# Patient Record
Sex: Male | Born: 1985 | Race: Black or African American | Hispanic: No | Marital: Single | Smoking: Current every day smoker
Health system: Southern US, Community
[De-identification: ages and names within clinical notes are randomized; demographics above are authoritative.]

## PROBLEM LIST (undated history)

## (undated) DIAGNOSIS — M419 Scoliosis, unspecified: Secondary | ICD-10-CM

## (undated) HISTORY — PX: TONSILLECTOMY: SUR1361

---

## 2018-07-19 ENCOUNTER — Emergency Department: Payer: Self-pay

## 2018-07-19 ENCOUNTER — Other Ambulatory Visit: Payer: Self-pay

## 2018-07-19 ENCOUNTER — Emergency Department
Admission: EM | Admit: 2018-07-19 | Discharge: 2018-07-19 | Disposition: A | Discharge status: Home / Self Care | Payer: Self-pay | Attending: Emergency Medicine | Admitting: Emergency Medicine

## 2018-07-19 DIAGNOSIS — M5431 Sciatica, right side: Secondary | ICD-10-CM | POA: Insufficient documentation

## 2018-07-19 HISTORY — DX: Scoliosis, unspecified: M41.9

## 2018-07-19 MED ORDER — PREDNISONE 10 MG PO TABS: 10.0000 mg | ORAL_TABLET | Freq: Every day | ORAL | 0 refills | Status: DC

## 2018-07-19 MED ORDER — TRAMADOL HCL 50 MG PO TABS: 50.0000 mg | ORAL_TABLET | Freq: Four times a day (QID) | ORAL | 0 refills | Status: DC | PRN

## 2018-07-19 MED ORDER — CYCLOBENZAPRINE HCL 10 MG PO TABS
10.0000 mg | ORAL_TABLET | Freq: Once | ORAL | Status: AC
  Administered 2018-07-19: 10 mg via ORAL
  Filled 2018-07-19: qty 1

## 2018-07-19 MED ORDER — TRAMADOL HCL 50 MG PO TABS
50.0000 mg | ORAL_TABLET | Freq: Once | ORAL | Status: AC
  Administered 2018-07-19: 50 mg via ORAL
  Filled 2018-07-19: qty 1

## 2018-07-19 MED ORDER — PREDNISONE 20 MG PO TABS
60.0000 mg | ORAL_TABLET | Freq: Once | ORAL | Status: AC
  Administered 2018-07-19: 23:00:00 60 mg via ORAL
  Filled 2018-07-19: qty 3

## 2018-07-19 MED ORDER — CYCLOBENZAPRINE HCL 5 MG PO TABS: 5.0000 mg | ORAL_TABLET | Freq: Three times a day (TID) | ORAL | 0 refills | Status: DC | PRN

## 2018-07-19 NOTE — Discharge Instructions (Signed)
Please take medications as prescribed.  Avoid heavy lifting pushing pulling.  If any increasing pain burning numbness tingling or weakness return to the ER.  Follow-up with primary care provider in 1 week if no improvement.

## 2018-07-19 NOTE — ED Triage Notes (Signed)
Pt comes via POV from home with c/o right leg pain. Pt states pain shooting from butt bone to calf and foot.  Pt states it is tingling.  Pt state this started last night while at work. Pt states he works in a [employer]. Pt denies any blurred vision, chest pain or SOB.  Pt denies any recent long distance trips. Pt states pain to bear weight.

## 2018-07-19 NOTE — ED Provider Notes (Signed)
[facility] EMERGENCY DEPARTMENT Provider Note   CSN: [id number] Arrival date & time: 07/19/18  1924     History   Chief Complaint Chief Complaint  Patient presents with  . Leg Pain    HPI Maxwell Vasquez is a 33 y.o. male presents to the emergency department for evaluation of right lumbar radicular symptoms.  Patient states yesterday developed pain burning numbness and tingling in the right lower back, lateral hip, lateral calf and into the lateral aspect of ankle.  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  No fevers, loss of bowel or bladder symptoms.  Pain is moderate.  Has not take any medications for pain.  He is ambulatory no assistive device.    HPI  Past Medical History:  Diagnosis Date  . Scoliosis     There are no active problems to display for this patient.   Past Surgical History:  Procedure Laterality Date  . TONSILLECTOMY          Home Medications    Prior to Admission medications   Medication Sig Start Date End Date Taking? Authorizing Provider  cyclobenzaprine (FLEXERIL) 5 MG tablet Take 1-2 tablets (5-10 mg total) by mouth 3 (three) times daily as needed for muscle spasms. 07/19/18   Duanne Guess, PA-C  predniSONE (DELTASONE) 10 MG tablet Take 1 tablet (10 mg total) by mouth daily. 6,5,4,3,2,1 six day taper 07/19/18   Duanne Guess, PA-C  traMADol (ULTRAM) 50 MG tablet Take 1 tablet (50 mg total) by mouth every 6 (six) hours as needed. 07/19/18 07/19/19  Duanne Guess, PA-C    Family History No family history on file.  Social History Social History   Tobacco Use  . Smoking status: Not on file  Substance Use Topics  . Alcohol use: Not on file  . Drug use: Not on file     Allergies   Patient has no allergy information on record.   Review of Systems Review of Systems  Constitutional: Negative for fever.  Gastrointestinal: Negative for abdominal pain.   Genitourinary: Negative for discharge and dysuria.  Musculoskeletal: Positive for back pain and myalgias. Negative for gait problem.  Skin: Negative for rash.  Neurological: Positive for numbness.     Physical Exam Updated Vital Signs BP 126/74   Pulse 72   Temp 98.7 F (37.1 C)   Resp 18   Ht 6\' 7"  (2.007 m)   Wt 104.3 kg   SpO2 99%   BMI 25.91 kg/m   Physical Exam Constitutional:      Appearance: He is well-developed.  HENT:     Head: Normocephalic and atraumatic.  Eyes:     Conjunctiva/sclera: Conjunctivae normal.  Neck:     Musculoskeletal: Normal range of motion.  Cardiovascular:     Rate and Rhythm: Normal rate.  Pulmonary:     Effort: Pulmonary effort is normal. No respiratory distress.  Musculoskeletal: Normal range of motion.     Comments: Lumbar Spine: Examination of the lumbar spine reveals no bony abnormality, no edema, and no ecchymosis.  There is no step off.  The patient has full range of motion of the lumbar spine with flexion and extension.  The patient has normal lateral bend and rotation.  The patient has no pain with range of motion activities.  The patient has a negative axial load test, and a negative rotational Waddell test.  The patient is non tender along the  spinous process.  The patient is non tender along the paravertebral muscles, with no muscle spasms.  The patient is non tender along the iliac crest.  The patient is non tender in the sciatic notch.  The patient is non tender along the Sacroiliac joint.  There is no Coccyx joint tenderness.    Bilateral Lower Extremities: Examination of the lower extremities reveals no bony abnormality, no edema, and no ecchymosis.  The patient has full active and passive range of motion of the hips, knees, and ankles.  There is no discomfort with range of motion exercises.  The patient is non tender along the greater trochanter region.  The patient has a negative Denna HaggardHomans' test bilaterally.  There is normal skin  warmth.  There is normal capillary refill bilaterally.    Neurologic: The patient has a negative straight leg raise.  The patient has normal muscle strength testing for the quadriceps, calves, ankle dorsiflexion, ankle plantarflexion, and extensor hallicus longus.  The patient has sensation that is intact to light touch.  Patellar reflexes are normal bilaterally.   Skin:    General: Skin is warm.     Findings: No rash.  Neurological:     Mental Status: He is alert and oriented to person, place, and time.  Psychiatric:        Behavior: Behavior normal.        Thought Content: Thought content normal.      ED Treatments / Results  Labs (all labs ordered are listed, but only abnormal results are displayed) Labs Reviewed - No data to display  EKG None  Radiology [place] Venous Img Lower Unilateral Right  Result Date: 07/19/2018 CLINICAL DATA:  Calf pain EXAM: RIGHT LOWER EXTREMITY VENOUS DOPPLER ULTRASOUND TECHNIQUE: Gray-scale sonography with graded compression, as well as color Doppler and duplex ultrasound were performed to evaluate the lower extremity deep venous systems from the level of the common femoral vein and including the common femoral, femoral, profunda femoral, popliteal and calf veins including the posterior tibial, peroneal and gastrocnemius veins when visible. The superficial great saphenous vein was also interrogated. Spectral Doppler was utilized to evaluate flow at rest and with distal augmentation maneuvers in the common femoral, femoral and popliteal veins. COMPARISON:  None. FINDINGS: Contralateral Common Femoral Vein: Respiratory phasicity is normal and symmetric with the symptomatic side. No evidence of thrombus. Normal compressibility. Common Femoral Vein: No evidence of thrombus. Normal compressibility, respiratory phasicity and response to augmentation. Saphenofemoral Junction: No evidence of thrombus. Normal compressibility and flow on color Doppler imaging. Profunda  Femoral Vein: No evidence of thrombus. Normal compressibility and flow on color Doppler imaging. Femoral Vein: No evidence of thrombus. Normal compressibility, respiratory phasicity and response to augmentation. Popliteal Vein: No evidence of thrombus. Normal compressibility, respiratory phasicity and response to augmentation. Calf Veins: No evidence of thrombus. Normal compressibility and flow on color Doppler imaging. Superficial Great Saphenous Vein: No evidence of thrombus. Normal compressibility. Venous Reflux:  None. Other Findings:  None. IMPRESSION: No evidence of deep venous thrombosis. Electronically Signed   By: Katherine Mantlehristopher  Green M.D.   On: 07/19/2018 21:14    Procedures Procedures (including critical care time)  Medications Ordered in ED Medications  predniSONE (DELTASONE) tablet 60 mg (has no administration in time range)  traMADol (ULTRAM) tablet 50 mg (has no administration in time range)  cyclobenzaprine (FLEXERIL) tablet 10 mg (has no administration in time range)     Initial Impression / Assessment and Plan / ED Course  I have reviewed  the triage vital signs and the nursing notes.  Pertinent labs & imaging results that were available during my care of the patient were reviewed by me and considered in my medical decision making (see chart for details).        33 year old male with right lumbar radiculopathy with no neurological deficits.  Vital signs stable, afebrile.  No urinary symptoms.  He is given prescription for prednisone, Flexeril and tramadol.  He understands signs symptoms return to ED for.  Final Clinical Impressions(s) / ED Diagnoses   Final diagnoses:  Sciatica of right side    ED Discharge Orders         Ordered    predniSONE (DELTASONE) 10 MG tablet  Daily     07/19/18 2236    traMADol (ULTRAM) 50 MG tablet  Every 6 hours PRN     07/19/18 2236    cyclobenzaprine (FLEXERIL) 5 MG tablet  3 times daily PRN     07/19/18 2236           Evon SlackGaines,  [facility] C, PA-C 07/19/18 2240    Shaune PollackIsaacs, Cameron, MD 07/22/18 [phone]

## 2018-08-02 ENCOUNTER — Encounter: Payer: Self-pay | Admitting: Emergency Medicine

## 2018-08-02 ENCOUNTER — Emergency Department
Admission: EM | Admit: 2018-08-02 | Discharge: 2018-08-02 | Disposition: A | Discharge status: Home / Self Care | Payer: Self-pay | Attending: Emergency Medicine | Admitting: Emergency Medicine

## 2018-08-02 ENCOUNTER — Other Ambulatory Visit: Payer: Self-pay

## 2018-08-02 DIAGNOSIS — L02415 Cutaneous abscess of right lower limb: Secondary | ICD-10-CM | POA: Insufficient documentation

## 2018-08-02 DIAGNOSIS — L0291 Cutaneous abscess, unspecified: Secondary | ICD-10-CM

## 2018-08-02 DIAGNOSIS — F17200 Nicotine dependence, unspecified, uncomplicated: Secondary | ICD-10-CM | POA: Insufficient documentation

## 2018-08-02 MED ORDER — LIDOCAINE HCL (PF) 1 % IJ SOLN
5.0000 mL | Freq: Once | INTRAMUSCULAR | Status: AC
  Administered 2018-08-02: 20:00:00 5 mL via INTRADERMAL
  Filled 2018-08-02: qty 5

## 2018-08-02 MED ORDER — SULFAMETHOXAZOLE-TRIMETHOPRIM 800-160 MG PO TABS
1.0000 | ORAL_TABLET | Freq: Once | ORAL | Status: AC
  Administered 2018-08-02: 20:00:00 1 via ORAL
  Filled 2018-08-02: qty 1

## 2018-08-02 MED ORDER — SULFAMETHOXAZOLE-TRIMETHOPRIM 800-160 MG PO TABS: 1.0000 | ORAL_TABLET | Freq: Two times a day (BID) | ORAL | 0 refills | Status: DC

## 2018-08-02 NOTE — ED Triage Notes (Signed)
Pt presents to ED via POV with c/o abscess to R inner thigh. Pt states has been there x several days.

## 2018-08-02 NOTE — ED Notes (Signed)
See triage note  Presents with possible abscess area to right groin  Noticed some swelling and redness 2 days ago

## 2018-08-02 NOTE — ED Provider Notes (Signed)
[facility] Emergency Department Provider Note  ____________________________________________  Time seen: Approximately 5:12 PM  I have reviewed the triage vital signs and the nursing notes.   HISTORY  Chief Complaint Abscess    HPI Maxwell Vasquez is a 33 y.o. male that presents to the emergency department for evaluation of erythema to right medial thigh. Redness and pain started out on Wednesday night and has been growing in size since. Pain is a burning sensation. He used alcohol in the area in hopes to improve infection. No fever.   Past Medical History:  Diagnosis Date  . Scoliosis     There are no active problems to display for this patient.   Past Surgical History:  Procedure Laterality Date  . TONSILLECTOMY      Prior to Admission medications   Medication Sig Start Date End Date Taking? Authorizing Provider  sulfamethoxazole-trimethoprim (BACTRIM DS) 800-160 MG tablet Take 1 tablet by mouth 2 (two) times daily. 08/02/18   Enid DerryWagner, Lory Nowaczyk, PA-C    Allergies Penicillins  History reviewed. No pertinent family history.  Social History Social History   Tobacco Use  . Smoking status: Current Every Day Smoker  . Smokeless tobacco: Never Used  Substance Use Topics  . Alcohol use: Not Currently  . Drug use: Not Currently     Review of Systems  Constitutional: No fever/chills Gastrointestinal: No nausea, no vomiting.  Musculoskeletal: Positive for leg pain over rash. Skin: Negative for abrasions, lacerations, ecchymosis. Positive for rash.   ____________________________________________   PHYSICAL EXAM:  VITAL SIGNS: ED Triage Vitals  Enc Vitals Group     BP 08/02/18 1633 108/68     Pulse Rate 08/02/18 1633 70     Resp 08/02/18 1633 18     Temp 08/02/18 1633 98.4 F (36.9 C)     Temp Source 08/02/18 1633 Oral     SpO2 08/02/18 1633 99 %     Weight 08/02/18 1629 229 lb 12.8 oz (104.2 kg)     Height 08/02/18 1629 6\' 7"  (2.007 m)      Head Circumference --      Peak Flow --      Pain Score 08/02/18 1628 8     Pain Loc --      Pain Edu? --      Excl. in GC? --      Constitutional: Alert and oriented. Well appearing and in no acute distress. Eyes: Conjunctivae are normal. PERRL. EOMI. Head: Atraumatic. ENT:      Ears:      Nose: No congestion/rhinnorhea.      Mouth/Throat: Mucous membranes are moist.  Neck: No stridor. Cardiovascular: Normal rate, regular rhythm.  Good peripheral circulation. Respiratory: Normal respiratory effort without tachypnea or retractions. Lungs CTAB. Good air entry to the bases with no decreased or absent breath sounds. Musculoskeletal: Full range of motion to all extremities. No gross deformities appreciated. Neurologic:  Normal speech and language. No gross focal neurologic deficits are appreciated.  Skin:  Skin is warm, dry.  3 inch area of erythema to right medial thigh with 1 cm central swelling and pain. Psychiatric: Mood and affect are normal. Speech and behavior are normal. Patient exhibits appropriate insight and judgement.   ____________________________________________   LABS (all labs ordered are listed, but only abnormal results are displayed)  Labs Reviewed - No data to display ____________________________________________  EKG   ____________________________________________  RADIOLOGY   No results found.  ____________________________________________    PROCEDURES  Procedure(s) performed:  Procedures  INCISION AND DRAINAGE Performed by: Laban Emperor Consent: Verbal consent obtained. Risks and benefits: risks, benefits and alternatives were discussed Type: abscess  Body area: thigh  Anesthesia: local infiltration  Incision was made with a scalpel.  Local anesthetic: lidocaine 1 % without epinephrine  Anesthetic total: 4 ml  Complexity: complex Blunt dissection to break up loculations  Drainage: purulent  Drainage amount:  moderate  Packing material: 1/4 in iodoform gauze  Patient tolerance: Patient tolerated the procedure well with no immediate complications.    Medications  lidocaine (PF) (XYLOCAINE) 1 % injection 5 mL (5 mLs Intradermal Given 08/02/18 1933)  sulfamethoxazole-trimethoprim (BACTRIM DS) 800-160 MG per tablet 1 tablet (1 tablet Oral Given 08/02/18 1933)     ____________________________________________   INITIAL IMPRESSION / ASSESSMENT AND PLAN / ED COURSE  Pertinent labs & imaging results that were available during my care of the patient were reviewed by me and considered in my medical decision making (see chart for details).  Review of the [place] CSRS was performed in accordance of the [facility] prior to dispensing any controlled drugs.     Patient presented to the emergency department for evaluation of abscess.  Vital signs and exam are reassuring.  Abscess was drained and packed in the emergency department.  Pain improved after incision and drainage.  Patient will be discharged home with prescriptions for Bactrim. Patient is to follow up with emergency department as directed. Patient is given ED precautions to return to the ED for any worsening or new symptoms.  Maxwell Vasquez was evaluated in Emergency Department on 08/02/2018 for the symptoms described in the history of present illness. He was evaluated in the context of the global COVID-19 pandemic, which necessitated consideration that the patient might be at risk for infection with the SARS-CoV-2 virus that causes COVID-19. Institutional protocols and algorithms that pertain to the evaluation of patients at risk for COVID-19 are in a state of rapid change based on information released by regulatory bodies including the CDC and federal and state organizations. These policies and algorithms were followed during the patient's care in the ED.   ____________________________________________  FINAL CLINICAL IMPRESSION(S) / ED DIAGNOSES  Final  diagnoses:  Abscess      NEW MEDICATIONS STARTED DURING THIS VISIT:  ED Discharge Orders         Ordered    sulfamethoxazole-trimethoprim (BACTRIM DS) 800-160 MG tablet  2 times daily     08/02/18 1927              This chart was dictated using voice recognition software/Dragon. Despite best efforts to proofread, errors can occur which can change the meaning. Any change was purely unintentional.    Laban Emperor, PA-C 08/02/18 1954    Harvest Dark, MD 08/02/18 2253

## 2018-08-02 NOTE — Discharge Instructions (Signed)
Please return to the emergency department in 2 days for packing removal.  Return to the emergency department if pain, swelling or redness worsens or you develop a fever.

## 2018-08-17 ENCOUNTER — Other Ambulatory Visit: Payer: Self-pay

## 2018-08-17 ENCOUNTER — Emergency Department
Admission: EM | Admit: 2018-08-17 | Discharge: 2018-08-17 | Disposition: A | Discharge status: Home / Self Care | Payer: Self-pay | Attending: Emergency Medicine | Admitting: Emergency Medicine

## 2018-08-17 ENCOUNTER — Encounter: Payer: Self-pay | Admitting: Emergency Medicine

## 2018-08-17 DIAGNOSIS — F17200 Nicotine dependence, unspecified, uncomplicated: Secondary | ICD-10-CM | POA: Insufficient documentation

## 2018-08-17 DIAGNOSIS — L0291 Cutaneous abscess, unspecified: Secondary | ICD-10-CM

## 2018-08-17 DIAGNOSIS — L02415 Cutaneous abscess of right lower limb: Secondary | ICD-10-CM | POA: Insufficient documentation

## 2018-08-17 MED ORDER — LIDOCAINE-EPINEPHRINE 2 %-1:100000 IJ SOLN
20.0000 mL | Freq: Once | INTRAMUSCULAR | Status: AC
  Administered 2018-08-17: 20 mL via INTRADERMAL

## 2018-08-17 MED ORDER — SULFAMETHOXAZOLE-TRIMETHOPRIM 800-160 MG PO TABS: 1.0000 | ORAL_TABLET | Freq: Two times a day (BID) | ORAL | 0 refills | Status: AC

## 2018-08-17 MED ORDER — LIDOCAINE-EPINEPHRINE (PF) 1 %-1:200000 IJ SOLN: 30.0000 mL | Freq: Once | INTRAMUSCULAR | Status: DC

## 2018-08-17 MED ORDER — HYDROCODONE-ACETAMINOPHEN 5-325 MG PO TABS: 1.0000 | ORAL_TABLET | Freq: Four times a day (QID) | ORAL | 0 refills | Status: AC | PRN

## 2018-08-17 NOTE — ED Triage Notes (Signed)
Pt presents to ED c/o abscess to R inner thigh. Pt states he was seen for same 2 wks ago, took abt as directed, and abscess went away with abt but returned a few days after completing course. Pt states he was told at last visit that he would need to return to have it drained again but has been putting it off. Reports abscess this time is not as bad as first time.

## 2018-08-17 NOTE — ED Provider Notes (Signed)
[facility] Emergency Department Provider Note  ____________________________________________  Time seen: Approximately 6:44 PM  I have reviewed the triage vital signs and the nursing notes.   HISTORY  Chief Complaint Abscess   HPI Maxwell GeroldJamie Mcinerney is a 33 y.o. male who presents to the emergency department for treatment and evaluation of an abscess to the right inner thigh.  He was here 2 weeks ago and had an I&D performed.  He finished the antibiotics as directed.  Last night he noticed that the area was painful and beginning to swell again.  He denies fever or other symptoms of concern.   Past Medical History:  Diagnosis Date  . Scoliosis     There are no active problems to display for this patient.   Past Surgical History:  Procedure Laterality Date  . TONSILLECTOMY      Prior to Admission medications   Medication Sig Start Date End Date Taking? Authorizing Provider  HYDROcodone-acetaminophen (NORCO/VICODIN) 5-325 MG tablet Take 1 tablet by mouth every 6 (six) hours as needed for up to 3 days for severe pain. 08/17/18 08/20/18  Lasya Vetter, Rulon Eisenmengerari B, FNP  sulfamethoxazole-trimethoprim (BACTRIM DS) 800-160 MG tablet Take 1 tablet by mouth 2 (two) times daily. 08/17/18   Chinita Pesterriplett, Doretha Goding B, FNP    Allergies Penicillins  History reviewed. No pertinent family history.  Social History Social History   Tobacco Use  . Smoking status: Current Every Day Smoker  . Smokeless tobacco: Never Used  Substance Use Topics  . Alcohol use: Not Currently  . Drug use: Not Currently    Review of Systems  Constitutional: Negative for fever. Respiratory: Negative for cough or shortness of breath.  Musculoskeletal: Negative for myalgias Skin: Positive for skin lesion on the right inner thigh Neurological: Negative for numbness or paresthesias. ____________________________________________   PHYSICAL EXAM:  VITAL SIGNS: ED Triage Vitals  Enc Vitals Group     BP  08/17/18 1722 119/60     Pulse Rate 08/17/18 1722 60     Resp 08/17/18 1722 18     Temp 08/17/18 1722 98.9 F (37.2 C)     Temp Source 08/17/18 1722 Oral     SpO2 08/17/18 1722 99 %     Weight 08/17/18 1721 220 lb (99.8 kg)     Height 08/17/18 1721 6\' 7"  (2.007 m)     Head Circumference --      Peak Flow --      Pain Score 08/17/18 1727 7     Pain Loc --      Pain Edu? --      Excl. in GC? --      Constitutional: Well appearing. Eyes: Conjunctivae are clear without discharge or drainage. Nose: No rhinorrhea noted. Mouth/Throat: Airway is patent.  Neck: No stridor. Unrestricted range of motion observed. Cardiovascular: Capillary refill is <3 seconds.  Respiratory: Respirations are even and unlabored.. Musculoskeletal: Unrestricted range of motion observed. Neurologic: Awake, alert, and oriented x 4.  Skin: Fluctuant, erythematous area over the right inner thigh with surrounding induration.  ____________________________________________   LABS (all labs ordered are listed, but only abnormal results are displayed)  Labs Reviewed - No data to display ____________________________________________  EKG  Not indicated. ____________________________________________  RADIOLOGY  Not indicated ____________________________________________   PROCEDURES  .Marland Kitchen.Incision and Drainage  Date/Time: 08/17/2018 6:45 PM Performed by: Chinita Pesterriplett, Nira Visscher B, FNP Authorized by: Chinita Pesterriplett, Semaje Kinker B, FNP   Consent:    Consent obtained:  Verbal   Consent given by:  Patient  Risks discussed:  Bleeding, infection, incomplete drainage and pain   Alternatives discussed:  Observation and delayed treatment Location:    Type:  Abscess   Location:  Lower extremity   Lower extremity location:  Leg   Leg location:  R upper leg Pre-procedure details:    Skin preparation:  Betadine Anesthesia (see MAR for exact dosages):    Anesthesia method:  Local infiltration   Local anesthetic:  Lidocaine 2% WITH  epi Procedure type:    Complexity:  Complex Procedure details:    Incision types:  Single straight   Scalpel blade:  11   Wound management:  Probed and deloculated   Drainage:  Bloody and purulent   Drainage amount:  Moderate   Wound treatment:  Drain placed   Packing materials:  1/4 in iodoform gauze Post-procedure details:    Patient tolerance of procedure:  Tolerated well, no immediate complications   ____________________________________________   INITIAL IMPRESSION / ASSESSMENT AND PLAN / ED COURSE  Carlas Vandyne is a 33 y.o. male presenting to the emergency department for treatment and evaluation of recurrent abscess to the right inner thigh.  Incision and drainage was performed as above.  Patient be placed on Bactrim and is to pull the packing in 2 days if he is not seeing any more yellow drainage.  If he does continue to see drainage, he is to leave the packing in place return to the emergency department for reevaluation.  Work note for tonight was also provided.   Medications  lidocaine-EPINEPHrine (XYLOCAINE W/EPI) 2 %-1:100000 (with pres) injection 20 mL (20 mLs Intradermal Given 08/17/18 1818)     Pertinent labs & imaging results that were available during my care of the patient were reviewed by me and considered in my medical decision making (see chart for details).  ____________________________________________   FINAL CLINICAL IMPRESSION(S) / ED DIAGNOSES  Final diagnoses:  Abscess    ED Discharge Orders         Ordered    sulfamethoxazole-trimethoprim (BACTRIM DS) 800-160 MG tablet  2 times daily     08/17/18 1836    HYDROcodone-acetaminophen (NORCO/VICODIN) 5-325 MG tablet  Every 6 hours PRN     08/17/18 1836           Note:  This document was prepared using Dragon voice recognition software and may include unintentional dictation errors.   Victorino Dike, FNP 08/17/18 1847    Schuyler Amor, MD 08/17/18 [phone]

## 2018-08-17 NOTE — Discharge Instructions (Signed)
Please pull the packing out in 2 days if your aren't seeing any yellow drainage. If you do see yellow drainage, leave the packing in and come back to the ER.

## 2018-08-19 ENCOUNTER — Emergency Department
Admission: EM | Admit: 2018-08-19 | Discharge: 2018-08-19 | Disposition: A | Discharge status: Home / Self Care | Payer: Self-pay | Attending: Emergency Medicine | Admitting: Emergency Medicine

## 2018-08-19 ENCOUNTER — Other Ambulatory Visit: Payer: Self-pay

## 2018-08-19 DIAGNOSIS — Z4800 Encounter for change or removal of nonsurgical wound dressing: Secondary | ICD-10-CM | POA: Insufficient documentation

## 2018-08-19 DIAGNOSIS — L02415 Cutaneous abscess of right lower limb: Secondary | ICD-10-CM | POA: Insufficient documentation

## 2018-08-19 DIAGNOSIS — Z5189 Encounter for other specified aftercare: Secondary | ICD-10-CM

## 2018-08-19 NOTE — ED Triage Notes (Signed)
Patient here on Friday and had an I&D of an abscess on the right inner thigh. Is here to have packing removed.

## 2018-08-20 NOTE — ED Provider Notes (Signed)
[facility] Emergency Department Provider Note  ____________________________________________  Time seen: Approximately 12:02 AM  I have reviewed the triage vital signs and the nursing notes.   HISTORY  Chief Complaint Wound Check (Packing removal)    HPI Maxwell Vasquez is a 33 y.o. male presents to the emergency department for packing removal.  Patient had an incision and drainage procedure 2 days ago.  Patient states that he has been healing without complication.  He has been taking his antibiotic as directed.  He has no concerns.        Past Medical History:  Diagnosis Date  . Scoliosis     There are no active problems to display for this patient.   Past Surgical History:  Procedure Laterality Date  . TONSILLECTOMY      Prior to Admission medications   Medication Sig Start Date End Date Taking? Authorizing Provider  HYDROcodone-acetaminophen (NORCO/VICODIN) 5-325 MG tablet Take 1 tablet by mouth every 6 (six) hours as needed for up to 3 days for severe pain. 08/17/18 08/20/18  Triplett, Rulon Eisenmengerari B, FNP  sulfamethoxazole-trimethoprim (BACTRIM DS) 800-160 MG tablet Take 1 tablet by mouth 2 (two) times daily. 08/17/18   Kem Boroughsriplett, Cari B, FNP    Allergies Penicillins  No family history on file.  Social History Social History   Tobacco Use  . Smoking status: Current Every Day Smoker    Types: Cigarettes  . Smokeless tobacco: Never Used  Substance Use Topics  . Alcohol use: Not Currently  . Drug use: Not Currently     Review of Systems  Constitutional: No fever/chills Eyes: No visual changes. No discharge ENT: No upper respiratory complaints. Cardiovascular: no chest pain. Respiratory: no cough. No SOB. Gastrointestinal: No abdominal pain.  No nausea, no vomiting.  No diarrhea.  No constipation. Musculoskeletal: Negative for musculoskeletal pain. Skin: Patient has resolving right inner thigh abscess. Neurological: Negative for headaches,  focal weakness or numbness.   ____________________________________________   PHYSICAL EXAM:  VITAL SIGNS: ED Triage Vitals  Enc Vitals Group     BP 08/19/18 2151 111/68     Pulse Rate 08/19/18 2151 88     Resp 08/19/18 2151 18     Temp 08/19/18 2151 99.1 F (37.3 C)     Temp Source 08/19/18 2151 Oral     SpO2 08/19/18 2151 97 %     Weight 08/19/18 2152 220 lb (99.8 kg)     Height 08/19/18 2152 6\' 7"  (2.007 m)     Head Circumference --      Peak Flow --      Pain Score 08/19/18 2151 1     Pain Loc --      Pain Edu? --      Excl. in GC? --      Constitutional: Alert and oriented. Well appearing and in no acute distress. Eyes: Conjunctivae are normal. PERRL. EOMI. Head: Atraumatic. Cardiovascular: Normal rate, regular rhythm. Normal S1 and S2.  Good peripheral circulation. Respiratory: Normal respiratory effort without tachypnea or retractions. Lungs CTAB. Good air entry to the bases with no decreased or absent breath sounds. Musculoskeletal: Full range of motion to all extremities. No gross deformities appreciated. Neurologic:  Normal speech and language. No gross focal neurologic deficits are appreciated.  Skin: Packing was removed from resolving right inner thigh abscess.  No surrounding cellulitis. Psychiatric: Mood and affect are normal. Speech and behavior are normal. Patient exhibits appropriate insight and judgement.   ____________________________________________   LABS (all labs ordered  are listed, but only abnormal results are displayed)  Labs Reviewed - No data to display ____________________________________________  EKG   ____________________________________________  RADIOLOGY   No results found.  ____________________________________________    PROCEDURES  Procedure(s) performed:    Procedures    Medications - No data to display   ____________________________________________   INITIAL IMPRESSION / ASSESSMENT AND PLAN / ED  COURSE  Pertinent labs & imaging results that were available during my care of the patient were reviewed by me and considered in my medical decision making (see chart for details).  Review of the [place] CSRS was performed in accordance of the [place] prior to dispensing any controlled drugs.           Assessment and plan Wound check 33 year old male presents to the emergency department for packing removal from a resolving right inner thigh abscess.  Packing was removed without complication.  Patient's wound was redressed in the emergency department.  Patient was advised to continue taking his antibiotics as directed.  All patient questions were answered.  ____________________________________________  FINAL CLINICAL IMPRESSION(S) / ED DIAGNOSES  Final diagnoses:  Visit for wound check      NEW MEDICATIONS STARTED DURING THIS VISIT:  ED Discharge Orders    None          This chart was dictated using voice recognition software/Dragon. Despite best efforts to proofread, errors can occur which can change the meaning. Any change was purely unintentional.    Lannie Fields, PA-C 08/20/18 0003    Harvest Dark, MD 08/22/18 [phone]

## 2018-08-30 ENCOUNTER — Emergency Department: Payer: Self-pay

## 2018-08-30 ENCOUNTER — Other Ambulatory Visit: Payer: Self-pay

## 2018-08-30 ENCOUNTER — Emergency Department
Admission: EM | Admit: 2018-08-30 | Discharge: 2018-08-30 | Disposition: A | Discharge status: Home / Self Care | Payer: Self-pay | Attending: Emergency Medicine | Admitting: Emergency Medicine

## 2018-08-30 ENCOUNTER — Encounter: Payer: Self-pay | Admitting: Intensive Care

## 2018-08-30 DIAGNOSIS — X58XXXA Exposure to other specified factors, initial encounter: Secondary | ICD-10-CM | POA: Insufficient documentation

## 2018-08-30 DIAGNOSIS — Y929 Unspecified place or not applicable: Secondary | ICD-10-CM | POA: Insufficient documentation

## 2018-08-30 DIAGNOSIS — F17210 Nicotine dependence, cigarettes, uncomplicated: Secondary | ICD-10-CM | POA: Insufficient documentation

## 2018-08-30 DIAGNOSIS — Y939 Activity, unspecified: Secondary | ICD-10-CM | POA: Insufficient documentation

## 2018-08-30 DIAGNOSIS — S39011A Strain of muscle, fascia and tendon of abdomen, initial encounter: Secondary | ICD-10-CM | POA: Insufficient documentation

## 2018-08-30 DIAGNOSIS — Y999 Unspecified external cause status: Secondary | ICD-10-CM | POA: Insufficient documentation

## 2018-08-30 LAB — COMPREHENSIVE METABOLIC PANEL
ALT: 19 U/L (ref 0–44)
AST: 21 U/L (ref 15–41)
Albumin: 4.1 g/dL (ref 3.5–5.0)
Alkaline Phosphatase: 56 U/L (ref 38–126)
Anion gap: 4 — ABNORMAL LOW (ref 5–15)
BUN: 21 mg/dL — ABNORMAL HIGH (ref 6–20)
CO2: 27 mmol/L (ref 22–32)
Calcium: 9.2 mg/dL (ref 8.9–10.3)
Chloride: 109 mmol/L (ref 98–111)
Creatinine, Ser: 0.91 mg/dL (ref 0.61–1.24)
GFR calc Af Amer: >60 mL/min (ref >60)
GFR calc non Af Amer: >60 mL/min (ref >60)
Glucose, Bld: 92 mg/dL (ref 70–99)
Potassium: 3.6 mmol/L (ref 3.5–5.1)
Sodium: 140 mmol/L (ref 135–145)
Total Bilirubin: 0.3 mg/dL (ref 0.3–1.2)
Total Protein: 6.4 g/dL — ABNORMAL LOW (ref 6.5–8.1)

## 2018-08-30 LAB — CBC WITH DIFFERENTIAL/PLATELET
Abs Immature Granulocytes: 0.02 10*3/uL (ref 0.00–0.07)
Basophils Absolute: 0 10*3/uL (ref 0.0–0.1)
Basophils Relative: 0 %
Eosinophils Absolute: 0.1 10*3/uL (ref 0.0–0.5)
Eosinophils Relative: 1 %
HCT: 40.5 % (ref 39.0–52.0)
Hemoglobin: 13.5 g/dL (ref 13.0–17.0)
Immature Granulocytes: 0 %
Lymphocytes Relative: 31 %
Lymphs Abs: 2.3 10*3/uL (ref 0.7–4.0)
MCH: 32.1 pg (ref 26.0–34.0)
MCHC: 33.3 g/dL (ref 30.0–36.0)
MCV: 96.4 fL (ref 80.0–100.0)
Monocytes Absolute: 0.4 10*3/uL (ref 0.1–1.0)
Monocytes Relative: 5 %
Neutro Abs: 4.6 10*3/uL (ref 1.7–7.7)
Neutrophils Relative %: 63 %
Platelets: 170 10*3/uL (ref 150–400)
RBC: 4.2 MIL/uL — ABNORMAL LOW (ref 4.22–5.81)
RDW: 14.6 % (ref 11.5–15.5)
WBC: 7.3 10*3/uL (ref 4.0–10.5)
nRBC: 0 % (ref 0.0–0.2)

## 2018-08-30 LAB — URINALYSIS, COMPLETE (UACMP) WITH MICROSCOPIC
Bacteria, UA: NONE SEEN
Bilirubin Urine: NEGATIVE
Glucose, UA: NEGATIVE mg/dL
Hgb urine dipstick: NEGATIVE
Ketones, ur: NEGATIVE mg/dL
Leukocytes,Ua: NEGATIVE
Nitrite: NEGATIVE
Protein, ur: NEGATIVE mg/dL
Specific Gravity, Urine: 1.028 (ref 1.005–1.030)
Squamous Epithelial / HPF: NONE SEEN (ref 0–5)
pH: 5 (ref 5.0–8.0)

## 2018-08-30 LAB — LIPASE, BLOOD: Lipase: 68 U/L — ABNORMAL HIGH (ref 11–51)

## 2018-08-30 LAB — LACTIC ACID, PLASMA: Lactic Acid, Venous: 1.2 mmol/L (ref 0.5–1.9)

## 2018-08-30 MED ORDER — METHOCARBAMOL 500 MG PO TABS
1000.0000 mg | ORAL_TABLET | Freq: Once | ORAL | Status: AC
  Administered 2018-08-30: 1000 mg via ORAL
  Filled 2018-08-30: qty 2

## 2018-08-30 MED ORDER — MELOXICAM 15 MG PO TABS: 15.0000 mg | ORAL_TABLET | Freq: Every day | ORAL | 1 refills | Status: AC

## 2018-08-30 MED ORDER — KETOROLAC TROMETHAMINE 30 MG/ML IJ SOLN
30.0000 mg | Freq: Once | INTRAMUSCULAR | Status: AC
  Administered 2018-08-30: 30 mg via INTRAVENOUS
  Filled 2018-08-30: qty 1

## 2018-08-30 MED ORDER — IOHEXOL 300 MG/ML  SOLN
100.0000 mL | Freq: Once | INTRAMUSCULAR | Status: AC | PRN
  Administered 2018-08-30: 100 mL via INTRAVENOUS
  Filled 2018-08-30: qty 100

## 2018-08-30 MED ORDER — METHOCARBAMOL 1000 MG/10ML IJ SOLN: 1000.0000 mg | Freq: Once | INTRAMUSCULAR | Status: DC

## 2018-08-30 MED ORDER — METHOCARBAMOL 500 MG PO TABS: 500.0000 mg | ORAL_TABLET | Freq: Three times a day (TID) | ORAL | 0 refills | Status: AC | PRN

## 2018-08-30 NOTE — ED Triage Notes (Signed)
Patient c/o waking up a couple of days ago and now experiencing left sided rib cage pain. Denies injury. Reports worse if he coughs due to dust at work

## 2018-08-30 NOTE — ED Provider Notes (Signed)
[facility] Emergency Department Provider Note  ____________________________________________  Time seen: Approximately 6:48 PM  I have reviewed the triage vital signs and the nursing notes.   HISTORY  Chief Complaint Chest Pain (left side)    HPI Maxwell Vasquez is a 33 y.o. male presents to the emergency department with left upper quadrant abdominal pain that has occurred for the past couple of days.  Patient guesses 2 to 3 days.  Patient states that he thinks his left-sided ribs are hurting him.  Patient states that it hurts when he takes a deep breath or when he moves.  He states that the pain is mostly constant.  He denies emesis or diarrhea.  No shortness of breath, chest tightness or chest pain.  No falls or traumas.  Patient denies recent surgery, prolonged immobilization, use of exogenous hormones or prolonged immobility.       Past Medical History:  Diagnosis Date  . Scoliosis     There are no active problems to display for this patient.   Past Surgical History:  Procedure Laterality Date  . TONSILLECTOMY      Prior to Admission medications   Medication Sig Start Date End Date Taking? Authorizing Provider  meloxicam (MOBIC) 15 MG tablet Take 1 tablet (15 mg total) by mouth daily for 7 days. 08/30/18 09/06/18  Orvil FeilWoods, Ethelle Ola M, PA-C  methocarbamol (ROBAXIN) 500 MG tablet Take 1 tablet (500 mg total) by mouth every 8 (eight) hours as needed for up to 5 days. 08/30/18 09/04/18  Orvil FeilWoods, Jamier Urbas M, PA-C  sulfamethoxazole-trimethoprim (BACTRIM DS) 800-160 MG tablet Take 1 tablet by mouth 2 (two) times daily. Patient not taking: Reported on 08/30/2018 08/17/18   Chinita Pesterriplett, Cari B, FNP    Allergies Penicillins  History reviewed. No pertinent family history.  Social History Social History   Tobacco Use  . Smoking status: Current Every Day Smoker    Types: Cigarettes  . Smokeless tobacco: Never Used  Substance Use Topics  . Alcohol use: Not Currently  .  Drug use: Not Currently     Review of Systems  Constitutional: No fever/chills Eyes: No visual changes. No discharge ENT: No upper respiratory complaints. Cardiovascular: no chest pain. Respiratory: no cough. No SOB. Gastrointestinal: Patient has abdominal pain.  No nausea, no vomiting.  No diarrhea.  No constipation. Genitourinary: Negative for dysuria. No hematuria Musculoskeletal: Negative for musculoskeletal pain. Skin: Negative for rash, abrasions, lacerations, ecchymosis. Neurological: Negative for headaches, focal weakness or numbness.  ____________________________________________   PHYSICAL EXAM:  VITAL SIGNS: ED Triage Vitals  Enc Vitals Group     BP 08/30/18 1740 109/65     Pulse Rate 08/30/18 1740 92     Resp 08/30/18 1740 16     Temp 08/30/18 1740 98.5 F (36.9 C)     Temp Source 08/30/18 1740 Oral     SpO2 08/30/18 1740 97 %     Weight 08/30/18 1741 220 lb (99.8 kg)     Height 08/30/18 1741 6\' 7"  (2.007 m)     Head Circumference --      Peak Flow --      Pain Score 08/30/18 1740 5     Pain Loc --      Pain Edu? --      Excl. in GC? --      Constitutional: Alert and oriented. Well appearing and in no acute distress. Eyes: Conjunctivae are normal. PERRL. EOMI. Head: Atraumatic. Cardiovascular: Normal rate, regular rhythm. Normal S1 and S2.  Good peripheral circulation. Respiratory: Normal respiratory effort without tachypnea or retractions. Lungs CTAB. Good air entry to the bases with no decreased or absent breath sounds. Gastrointestinal: Patient has left upper quadrant pain with guarding to palpation. Musculoskeletal: Full range of motion to all extremities. No gross deformities appreciated. Neurologic:  Normal speech and language. No gross focal neurologic deficits are appreciated.  Skin:  Skin is warm, dry and intact. No rash noted. Psychiatric: Mood and affect are normal. Speech and behavior are normal. Patient exhibits appropriate insight and  judgement.   ____________________________________________   LABS (all labs ordered are listed, but only abnormal results are displayed)  Labs Reviewed  CBC WITH DIFFERENTIAL/PLATELET - Abnormal; Notable for the following components:      Result Value   RBC 4.20 (*)    All other components within normal limits  COMPREHENSIVE METABOLIC PANEL - Abnormal; Notable for the following components:   BUN 21 (*)    Total Protein 6.4 (*)    Anion gap 4 (*)    All other components within normal limits  URINALYSIS, COMPLETE (UACMP) WITH MICROSCOPIC - Abnormal; Notable for the following components:   Color, Urine YELLOW (*)    APPearance CLEAR (*)    All other components within normal limits  LIPASE, BLOOD - Abnormal; Notable for the following components:   Lipase 68 (*)    All other components within normal limits  LACTIC ACID, PLASMA  LACTIC ACID, PLASMA   ____________________________________________  EKG   ____________________________________________  RADIOLOGY I personally viewed and evaluated these images as part of my medical decision making, as well as reviewing the written report by the radiologist.  Dg Ribs Unilateral W/chest Left  Result Date: 08/30/2018 CLINICAL DATA:  Pain EXAM: LEFT RIBS AND CHEST - 3+ VIEW COMPARISON:  None. FINDINGS: No fracture or other bone lesions are seen involving the ribs. There is no evidence of pneumothorax or pleural effusion. Both lungs are clear. Heart size and mediastinal contours are within normal limits. IMPRESSION: Negative. Electronically Signed   By: Katherine Mantlehristopher  Green M.D.   On: 08/30/2018 18:23   Ct Abdomen Pelvis W Contrast  Result Date: 08/30/2018 CLINICAL DATA:  Abdominal distension. Left-sided upper abdominal pain. EXAM: CT ABDOMEN AND PELVIS WITH CONTRAST TECHNIQUE: Multidetector CT imaging of the abdomen and pelvis was performed using the standard protocol following bolus administration of intravenous contrast. CONTRAST:  100mL  OMNIPAQUE IOHEXOL 300 MG/ML  SOLN COMPARISON:  None. FINDINGS: Lower chest: The lung bases are clear. The heart size is normal. Hepatobiliary: The liver is normal. Normal gallbladder.There is no biliary ductal dilation. Pancreas: Normal contours without ductal dilatation. No peripancreatic fluid collection. Spleen: No splenic laceration or hematoma. Adrenals/Urinary Tract: --Adrenal glands: No adrenal hemorrhage. --Right kidney/ureter: No hydronephrosis or perinephric hematoma. --Left kidney/ureter: No hydronephrosis or perinephric hematoma. --Urinary bladder: Unremarkable. Stomach/Bowel: --Stomach/Duodenum: No hiatal hernia or other gastric abnormality. Normal duodenal course and caliber. --Small bowel: No dilatation or inflammation. --Colon: No focal abnormality. --Appendix: Normal. Vascular/Lymphatic: Normal course and caliber of the major abdominal vessels. --No retroperitoneal lymphadenopathy. --No mesenteric lymphadenopathy. --No pelvic or inguinal lymphadenopathy. Reproductive: Unremarkable Other: No ascites or free air. The abdominal wall is normal. Musculoskeletal. No acute displaced fractures. IMPRESSION: 1. No acute abdominal abnormality. 2. No definite CT evidence for acute pancreatitis. Of note, the CT findings for pancreatitis can often lag behind the laboratory findings. Electronically Signed   By: Katherine Mantlehristopher  Green M.D.   On: 08/30/2018 19:56    ____________________________________________    PROCEDURES  Procedure(s) performed:    Procedures    Medications  iohexol (OMNIPAQUE) 300 MG/ML solution 100 mL (100 mLs Intravenous Contrast Given 08/30/18 1940)  ketorolac (TORADOL) 30 MG/ML injection 30 mg (30 mg Intravenous Given 08/30/18 2028)  methocarbamol (ROBAXIN) tablet 1,000 mg (1,000 mg Oral Given 08/30/18 2028)     ____________________________________________   INITIAL IMPRESSION / ASSESSMENT AND PLAN / ED COURSE  Pertinent labs & imaging results that were available during  my care of the patient were reviewed by me and considered in my medical decision making (see chart for details).  Review of the [place] CSRS was performed in accordance of the [place] prior to dispensing any controlled drugs.         Assessment and plan  abdominal pain 33 year old male presents to the emergency department with left upper quadrant abdominal pain that is occurred over the past 3 to 4 days worsened with deep inspiration and with movement.  Vital signs were reassuring at triage.  Differential diagnosis includes pancreatitis, intra-abdominal abscess, constipation, PE, splenic laceration, diverticulitis  Patient was PERC negative, decreasing suspicion for PE.  Basic labs were obtained in the emergency department.  Lipase was mildly elevated at 68.  CBC and CMP were reassuring.  CT abdomen and pelvis revealed no acute abnormality.  Patient was given Toradol and Robaxin in the emergency department he reports that his pain resolved.  Patient was discharged with meloxicam and Robaxin.  He was given strict return precautions to return to the emergency department with new or worsening symptoms.  All patient questions were answered. ____________________________________________  FINAL CLINICAL IMPRESSION(S) / ED DIAGNOSES  Final diagnoses:  Strain of abdominal wall, initial encounter      NEW MEDICATIONS STARTED DURING THIS VISIT:  ED Discharge Orders         Ordered    meloxicam (MOBIC) 15 MG tablet  Daily     08/30/18 2114    methocarbamol (ROBAXIN) 500 MG tablet  Every 8 hours PRN     08/30/18 2114              This chart was dictated using voice recognition software/Dragon. Despite best efforts to proofread, errors can occur which can change the meaning. Any change was purely unintentional.    Lannie Fields, PA-C 08/30/18 2255    Vanessa Rushmore, MD 08/31/18 [phone]

## 2020-12-25 IMAGING — CR LEFT RIBS AND CHEST - 3+ VIEW
5 series · 5 of 5 positions shown · non-contrast
Comparison: None.

CLINICAL DATA: Pain

EXAM:
LEFT RIBS AND CHEST - 3+ VIEW

[chest pa]
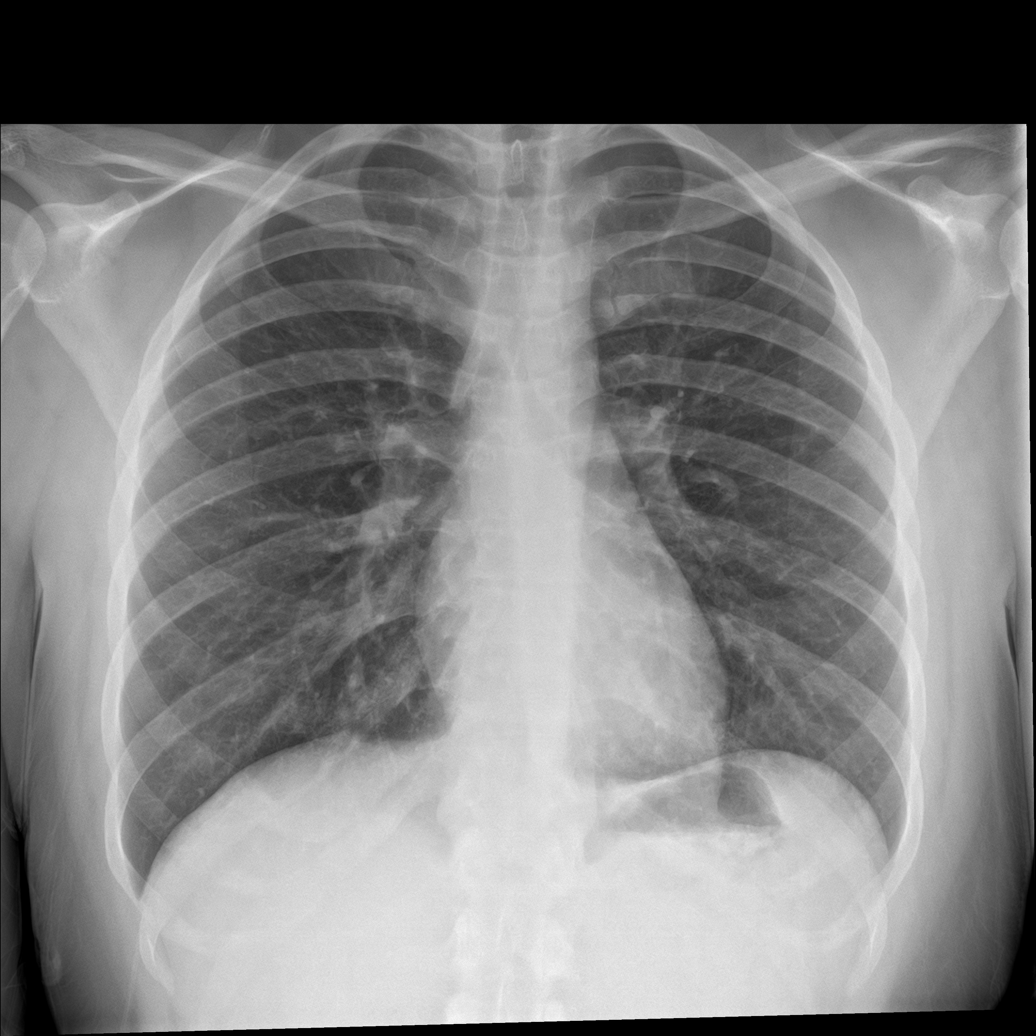

[rib pa]
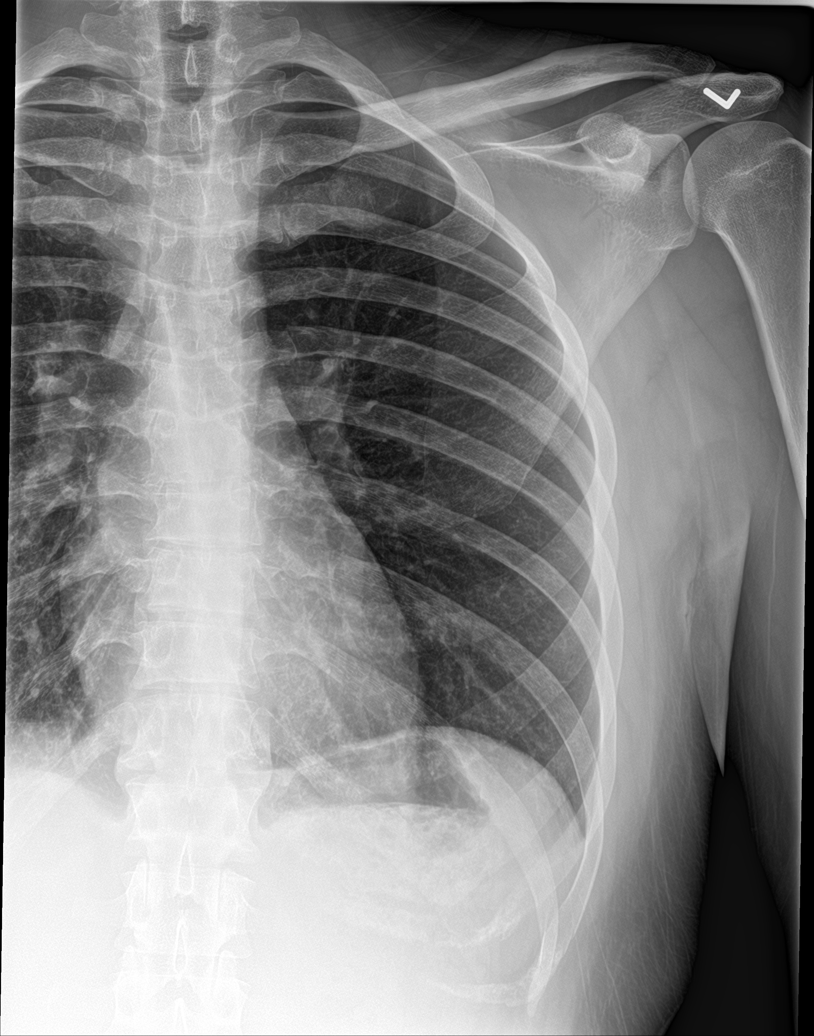

[rib pa obl (1 of 2)]
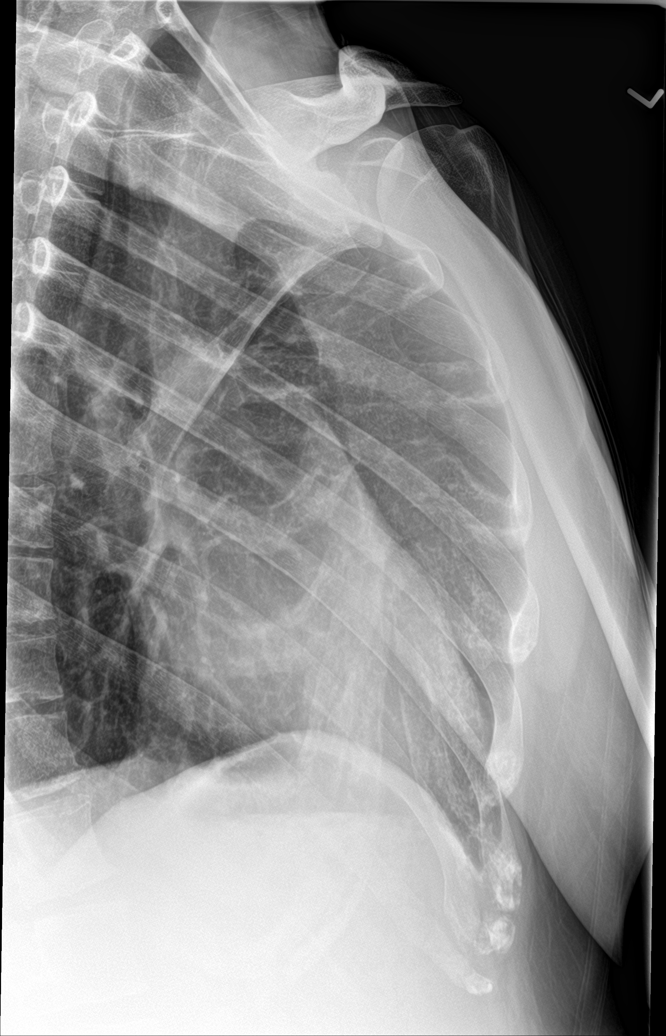

[rib ap]
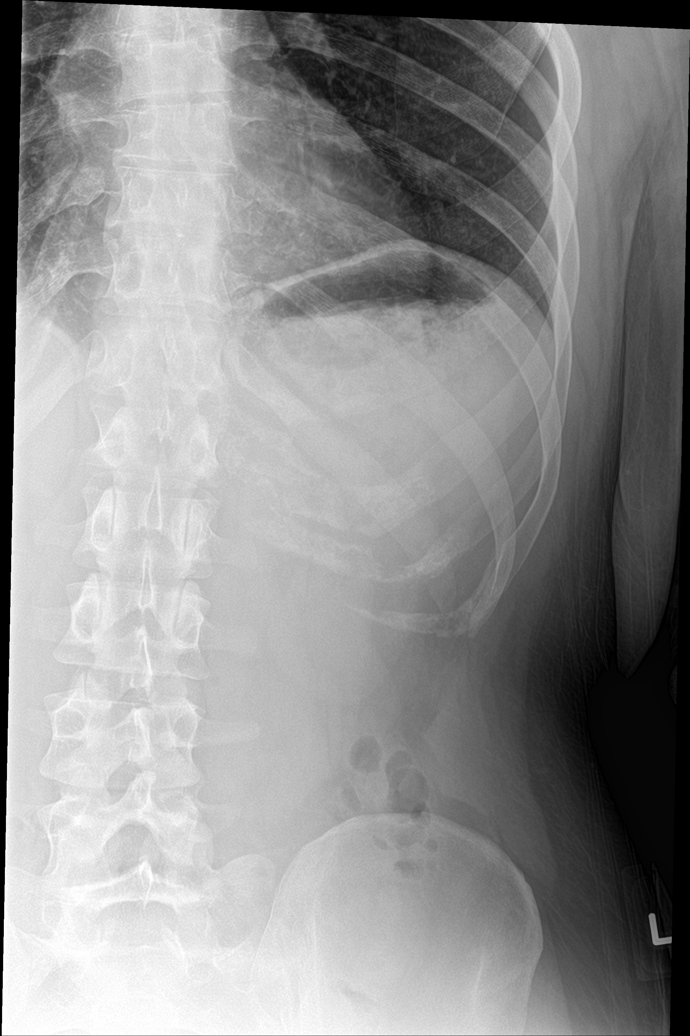

[rib pa obl (2 of 2)]
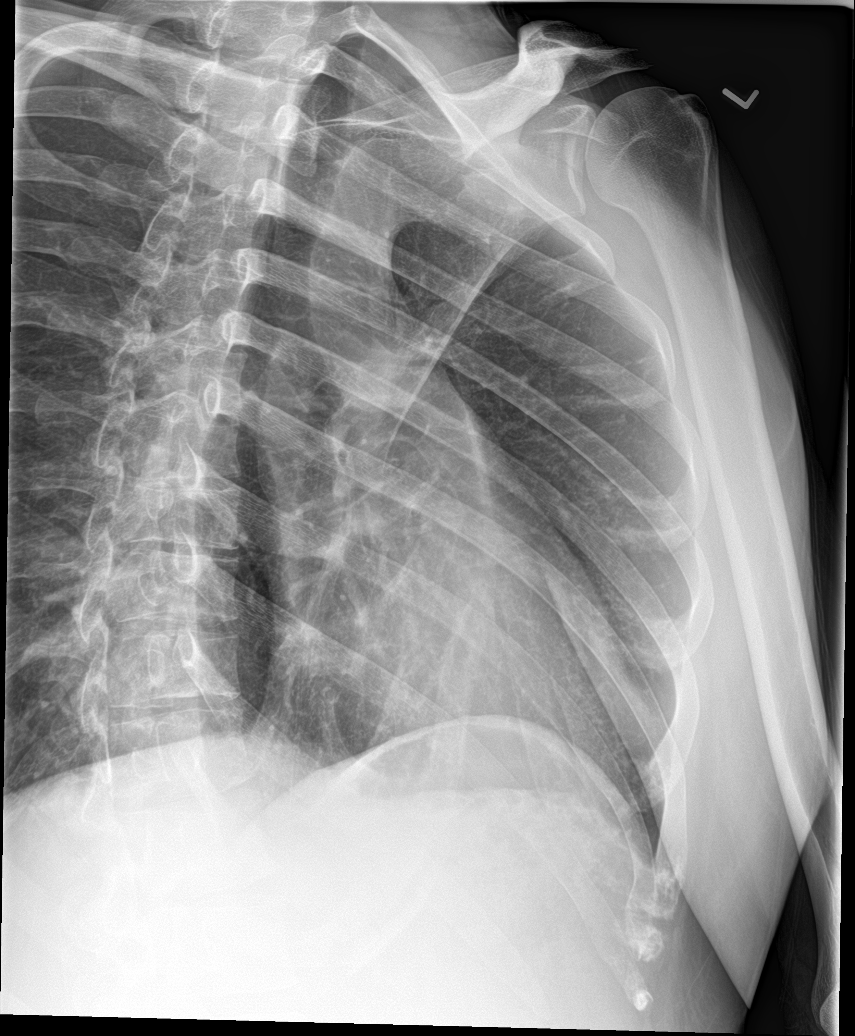

[5 of 5 positions shown; findings below may reference images not displayed]

FINDINGS: No fracture or other bone lesions are seen involving the ribs. There
is no evidence of pneumothorax or pleural effusion. Both lungs are
clear. Heart size and mediastinal contours are within normal limits.
IMPRESSION: Negative.
